# Patient Record
Sex: Female | Born: 1960 | Race: White | Hispanic: No | Marital: Married | Smoking: Never smoker
Health system: Southern US, Community
[De-identification: ages and names within clinical notes are randomized; demographics above are authoritative.]

## PROBLEM LIST (undated history)

## (undated) DIAGNOSIS — R519 Headache, unspecified: Secondary | ICD-10-CM

## (undated) DIAGNOSIS — Z8719 Personal history of other diseases of the digestive system: Secondary | ICD-10-CM

## (undated) DIAGNOSIS — R51 Headache: Secondary | ICD-10-CM

## (undated) DIAGNOSIS — K219 Gastro-esophageal reflux disease without esophagitis: Secondary | ICD-10-CM

## (undated) HISTORY — PX: TONSILLECTOMY: SUR1361

---

## 2004-06-30 ENCOUNTER — Ambulatory Visit: Payer: Self-pay | Admitting: Obstetrics and Gynecology

## 2004-07-11 ENCOUNTER — Ambulatory Visit: Payer: Self-pay | Admitting: Obstetrics and Gynecology

## 2005-08-15 ENCOUNTER — Ambulatory Visit: Payer: Self-pay | Admitting: Obstetrics and Gynecology

## 2006-09-26 ENCOUNTER — Ambulatory Visit: Payer: Self-pay | Admitting: Obstetrics and Gynecology

## 2006-09-28 ENCOUNTER — Ambulatory Visit: Payer: Self-pay | Admitting: Obstetrics and Gynecology

## 2007-11-07 ENCOUNTER — Ambulatory Visit: Payer: Self-pay | Admitting: Obstetrics and Gynecology

## 2008-11-17 ENCOUNTER — Ambulatory Visit: Payer: Self-pay | Admitting: Obstetrics and Gynecology

## 2009-11-30 ENCOUNTER — Ambulatory Visit: Payer: Self-pay | Admitting: Obstetrics and Gynecology

## 2010-12-14 ENCOUNTER — Ambulatory Visit: Payer: Self-pay | Admitting: Obstetrics and Gynecology

## 2011-06-28 ENCOUNTER — Ambulatory Visit: Payer: Self-pay | Admitting: General Practice

## 2011-12-20 ENCOUNTER — Ambulatory Visit: Payer: Self-pay | Admitting: Obstetrics and Gynecology

## 2013-10-27 ENCOUNTER — Ambulatory Visit: Payer: Self-pay | Admitting: Gastroenterology

## 2013-10-30 LAB — PATHOLOGY REPORT

## 2013-12-04 ENCOUNTER — Ambulatory Visit: Payer: Self-pay | Admitting: Surgery

## 2013-12-04 LAB — BASIC METABOLIC PANEL
Anion Gap: 5 — ABNORMAL LOW (ref 7–16)
BUN: 13 mg/dL (ref 7–18)
CALCIUM: 9.1 mg/dL (ref 8.5–10.1)
CHLORIDE: 108 mmol/L — AB (ref 98–107)
CO2: 26 mmol/L (ref 21–32)
Creatinine: 0.77 mg/dL (ref 0.60–1.30)
EGFR (Non-African Amer.): >60
Glucose: 96 mg/dL (ref 65–99)
Osmolality: 278 (ref 275–301)
Potassium: 3.8 mmol/L (ref 3.5–5.1)
SODIUM: 139 mmol/L (ref 136–145)

## 2013-12-04 LAB — CBC
HCT: 40.1 % (ref 35.0–47.0)
HGB: 13.5 g/dL (ref 12.0–16.0)
MCH: 30.4 pg (ref 26.0–34.0)
MCHC: 33.8 g/dL (ref 32.0–36.0)
MCV: 90 fL (ref 80–100)
Platelet: 377 10*3/uL (ref 150–440)
RBC: 4.45 10*6/uL (ref 3.80–5.20)
RDW: 13.2 % (ref 11.5–14.5)
WBC: 8.9 10*3/uL (ref 3.6–11.0)

## 2013-12-10 HISTORY — PX: HEMORRHOID SURGERY: SHX153

## 2013-12-11 ENCOUNTER — Ambulatory Visit: Payer: Self-pay | Admitting: Surgery

## 2013-12-12 LAB — PATHOLOGY REPORT

## 2014-07-13 ENCOUNTER — Ambulatory Visit: Payer: Self-pay | Admitting: Obstetrics and Gynecology

## 2015-01-02 NOTE — Op Note (Signed)
PATIENT NAME:  Rose Moore, Rose Moore MR#:  [id number] DATE OF BIRTH:  04/03/1961  DATE OF PROCEDURE:  12/11/2013  PREOPERATIVE DIAGNOSIS: Internal and external hemorrhoids, posterior anal fissure.   POSTOPERATIVE DIAGNOSIS: Internal and external hemorrhoids, posterior anal fissure.   PROCEDURE: Internal and external hemorrhoidectomy and lateral internal anal sphincterotomy.   SURGEON: Rochel Brome, M.D.   ANESTHESIA: General.   INDICATIONS: This 54 year old female has history of anal pain and bleeding. Has findings of a large hemorrhoid anteriorly also internal hemorrhoids. Also, a posterior anal fissure which has failed to heal with conservative therapy.   The patient was placed on the operating table in the supine position under general anesthesia. The legs were elevated into the lithotomy position using ankle straps. The anal area was prepared with Betadine solution and draped in a sterile manner.   Initial inspection revealed there was a large external hemorrhoid anteriorly. There was a posterior anal fissure. The anoderm and deeper tissues surrounding the sphincter were infiltrated with 20 mL of Exparel. Next, the anal canal was dilated large enough to admit four fingers. The bivalve retractor was introduced. There was a large external hemorrhoid anteriorly and slightly to the patient's right of the midline which appeared to be a column associated with a large external hemorrhoid. There were other smaller hemorrhoids which appeared to be circumferential and again noted the posterior anal fissure.   With the bivalve anal retractor in place. The internal and external hemorrhoidectomy was carried out at the 11 o'clock position. A high ligation was done with a 3-0 chromic suture ligature. A V-shaped incision was made externally. Electrocautery was used to dissect the external hemorrhoid away from surrounding tissues. This dissection was carried over the internal sphincter and up to include the internal  hemorrhoid to the previously placed suture ligature. The hemorrhoid was again ligated with the same suture ligature and amputated. Several small bleeding points were cauterized. Hemostasis was subsequently intact. The wound was closed with a running locked tied 3-0 chromic suture, leaving a small opening externally for drainage. Next, an additional hemorrhoid, which was external was excised with a transversely oriented incision extending from 12:00 to 2 o'clock position and several small bleeding points were cauterized and the wound was closed with interrupted 5-0 chromic simple sutures.   Next, the lateral internal anal sphincterotomy was done by making an incision on the patient's left side at the three o'clock position. This incision was 12 mm in length and carried down through subcutaneous tissues. Hemostats were used for blunt dissection and identified the internal anal sphincter, recognized its white color, 90% of the sphincter was divided with electrocautery. Bleeding was minimal and this wound was left open for drainage, although  skin edges were actually touching at the end of the procedure. The area was cleaned with saline solution and dressed with 4 x 4 gauze, 2 inch paper tape.   The patient appeared to be in satisfactory condition and was carried to the recovery room for postoperative care.   ____________________________ J. Rochel Brome, MD jws:sg D: 12/11/2013 11:49:15 ET T: 12/11/2013 12:16:08 ET JOB#: [id number]  cc: Loreli Dollar, MD, <Dictator> Loreli Dollar MD ELECTRONICALLY SIGNED 12/23/2013 8:48

## 2015-06-21 NOTE — H&P (Signed)
Chief Complaint:    Patient ID: Rose Moore is a 54 y.o. female presenting with Pre Op Consulting  on 06/14/2015  HPI: Preop visit for definitive treatment of AUB - Sx of menometrorrhagia.   SIS and ultrasound on 04/13/15 Showed: - Uterus: 159cm3, 8x6x5cm - Ovaries: normal and small - Other: fibroid 1.9cm - rt lat posterior fibroid. Submuscoal calcified fibroid noted 2cm.  Prior EMBx normal 05/28/14. TSH and prolactin both wnl. Husband with vasectomy. Prior pap neg in 2013 with neg HPV.  Pelvic exam noted 2 cm cervical polyp that was non-tender and not friable. No bx taken.  Had an allergic reaction to progesterone only pill and full bodied itching, but no hives.  Also has stress urinary incontinence, that doesn't bother her significantly but she would like investigated eventually. She also has anterior prolapse she would like repaired.  Past Medical History:  has a past medical history of Abnormal uterine bleeding; Anal fissure; Constipation due to slow transit; Fibroid; GERD (gastroesophageal reflux disease); Hemorrhoids; and Urinary incontinence, mixed.  Past Surgical History:  has a past surgical history that includes Hemorrhoidectomy Internal & External W/Fissurectomy. Family History: family history includes Diabetes mellitus in her mother; Diabetes type II in her mother; Hyperlipidemia in her father and mother; Hypertension in her father and mother; Thyroid disease in her mother. Social History:  reports that she has never smoked. She does not have any smokeless tobacco history on file. She reports that she does not drink alcohol or use illicit drugs. OB/GYN History:  OB History    Gravida Para Term Preterm AB TAB SAB Ectopic Multiple Living   3 2 2  1  1   2       Allergies: is allergic to amoxicillin and sulfa (sulfonamide antibiotics). Medications:  Current Outpatient Prescriptions:  . docusate (COLACE) 100 MG capsule, Take 100 mg by mouth 2  (two) times daily., Disp: , Rfl:  . LANSOPRAZOLE (PREVACID ORAL), Take 15 mg by mouth once daily., Disp: , Rfl:  . multivitamin capsule, Take 1 capsule by mouth once daily., Disp: , Rfl:  . norethindrone (MICRONOR) 0.35 mg tablet, Take 1 tablet (0.35 mg total) by mouth once daily., Disp: 1 Package, Rfl: 11 . ranitidine (ZANTAC) 150 MG tablet, Take 150 mg by mouth 2 (two) times daily., Disp: , Rfl:   Review of Systems  Constitutional: Negative. Negative for fatigue and fever.  HENT: Negative.  Respiratory: Negative. Negative for shortness of breath.  Cardiovascular: Negative for chest pain and palpitations.  Gastrointestinal: Negative for abdominal pain, constipation and diarrhea.  Endocrine: Negative for cold intolerance.  Genitourinary: Positive for menstrual problem. Negative for difficulty urinating, dyspareunia, dysuria, frequency, hematuria, pelvic pain, vaginal bleeding, vaginal discharge and vaginal pain.  Abnormal uterine bleeding  Neurological: Negative for dizziness and light-headedness.  Psychiatric/Behavioral:  No changes in mood    Exam:         Visit Vitals  . BP 138/90  . Pulse 88  . Wt 99.3 kg (219 lb)  . BMI 35.08 kg/m2   Physical Exam  Constitutional: She is oriented to person, place, and time. She appears well-developed and well-nourished. No distress.  Eyes: No scleral icterus.  Neck: Normal range of motion. Neck supple. No tracheal deviation present. No thyromegaly present.  Cardiovascular: Normal rate, regular rhythm and normal heart sounds.  No murmur heard. Pulmonary/Chest: Effort normal and breath sounds normal. She has no wheezes. She has no rales.  Abdominal: She exhibits no distension and no mass. There  is no tenderness. There is no rebound and no guarding.  Genitourinary:  Genitourinary Comments:  External: Tanner stage 5, normal female genitalia without lesions or masses Bladder: Normal size without masses or tenderness, Grade 1  cystocele Urethra: No lesions or discharge with palpation. Normal urethral size and location, no prolapse Vagina: normal physiological discharge, without lesions or masses Cervix: normal without lesions or masses Adnexa: normal bimanual exam without masses or fullness Uterus: Normal size and position without masses or tenderness. Good descent. Anus/Perineum: Normal external exam  Musculoskeletal: Normal range of motion.  Lymphadenopathy:  She has no cervical adenopathy.  Neurological: She is alert and oriented to person, place, and time.  Skin: Skin is warm and dry.  Psychiatric: She has a normal mood and affect.     Impression:   The primary encounter diagnosis was Abnormal uterine bleeding (AUB). Diagnoses of Submucous leiomyoma of uterus, Prolapse of anterior vaginal wall, and SUI (stress urinary incontinence, female) were also pertinent to this visit.    Plan:   Patient returns for a preoperative discussion regarding her plans to proceed with definitive surgical treatment of her AUB and fibroids by total vaginal hysterectomy with bilateral salpingectomy, with possible anterior repair and possible cystoscopy.  The patient and I discussed the technical aspects of the procedure including the potential for risks and complications. These include but are not limited to the risk of infection requiring post-operative antibiotics or further procedures. We talked about the risk of injury to adjacent organs including bladder, bowel, ureter, blood vessels or nerves. We talked about the need to convert to an open incision. We talked about the possible need for blood transfusion. We talked aboutpostop complications such asthromboembolic or cardiopulmonary complications. All of her questions were answered. Her preoperative exam was completed and the appropriate consents were signed. She is scheduled to undergo this procedure in the near future.

## 2015-06-24 ENCOUNTER — Encounter
Admission: RE | Admit: 2015-06-24 | Discharge: 2015-06-24 | Disposition: A | Discharge status: Home / Self Care | Payer: BLUE CROSS/BLUE SHIELD | Source: Ambulatory Visit | Attending: Obstetrics and Gynecology | Admitting: Obstetrics and Gynecology

## 2015-06-24 DIAGNOSIS — Z01818 Encounter for other preprocedural examination: Secondary | ICD-10-CM | POA: Diagnosis present

## 2015-06-24 HISTORY — DX: Personal history of other diseases of the digestive system: Z87.19

## 2015-06-24 HISTORY — DX: Gastro-esophageal reflux disease without esophagitis: K21.9

## 2015-06-24 HISTORY — DX: Headache: R51

## 2015-06-24 HISTORY — DX: Headache, unspecified: R51.9

## 2015-06-24 LAB — BASIC METABOLIC PANEL
ANION GAP: 5 (ref 5–15)
BUN: 19 mg/dL (ref 6–20)
CALCIUM: 9.4 mg/dL (ref 8.9–10.3)
CO2: 26 mmol/L (ref 22–32)
Chloride: 104 mmol/L (ref 101–111)
Creatinine, Ser: 0.61 mg/dL (ref 0.44–1.00)
Glucose, Bld: 95 mg/dL (ref 65–99)
POTASSIUM: 4.1 mmol/L (ref 3.5–5.1)
Sodium: 135 mmol/L (ref 135–145)

## 2015-06-24 LAB — CBC
HEMATOCRIT: 37.6 % (ref 35.0–47.0)
HEMOGLOBIN: 13 g/dL (ref 12.0–16.0)
MCH: 29.8 pg (ref 26.0–34.0)
MCHC: 34.4 g/dL (ref 32.0–36.0)
MCV: 86.6 fL (ref 80.0–100.0)
Platelets: 402 10*3/uL (ref 150–440)
RBC: 4.35 MIL/uL (ref 3.80–5.20)
RDW: 13.8 % (ref 11.5–14.5)
WBC: 10 10*3/uL (ref 3.6–11.0)

## 2015-06-24 LAB — ABO/RH: ABO/RH(D): O NEG

## 2015-06-24 LAB — TYPE AND SCREEN
ABO/RH(D): O NEG
ANTIBODY SCREEN: NEGATIVE

## 2015-06-24 NOTE — Patient Instructions (Signed)
  Your procedure is scheduled on: July 12, 2015 (Monday) Report to Day Surgery.[facility]) To find out your arrival time please call [phone] between 1PM - 3PM on July 09, 2015 (Friday)  Remember: Instructions that are not followed completely may result in serious medical risk, up to and including death, or upon the discretion of your surgeon and anesthesiologist your surgery may need to be rescheduled.    __x__ 1. Do not eat food or drink liquids after midnight. No gum chewing or hard candies.     ____ 2. No Alcohol for 24 hours before or after surgery.   ____ 3. Bring all medications with you on the day of surgery if instructed.    _x___ 4. Notify your doctor if there is any change in your medical condition     (cold, fever, infections).     Do not wear jewelry, make-up, hairpins, clips or nail polish.  Do not wear lotions, powders, or perfumes. You may wear deodorant.  Do not shave 48 hours prior to surgery. Men may shave face and neck.  Do not bring valuables to the hospital.    [facility] is not responsible for any belongings or valuables.               Contacts, dentures or bridgework may not be worn into surgery.  Leave your suitcase in the car. After surgery it may be brought to your room.  For patients admitted to the hospital, discharge time is determined by your                treatment team.   Patients discharged the day of surgery will not be allowed to drive home.   Please read over the following fact sheets that you were given:   Surgical Site Infection Prevention   __x__ Take these medicines the morning of surgery with A SIP OF WATER:    1. Lansoprazole (Lansoprazole at bedtime on 10/30)  2. Magnesium  3.   4.  5.  6.  ____ Fleet Enema (as directed)   __x__ Use CHG Soap as directed  ____ Use inhalers on the day of surgery  ____ Stop metformin 2 days prior to surgery    ____ Take 1/2 of usual insulin dose the night before surgery and none  on the morning of surgery.   ____ Stop Coumadin/Plavix/aspirin on   _x___ Stop Anti-inflammatories on (Tylenol ok to take for pain)   ____ Stop supplements until after surgery.    ____ Bring C-Pap to the hospital.

## 2015-07-12 ENCOUNTER — Encounter: Payer: Self-pay | Admitting: *Deleted

## 2015-07-12 ENCOUNTER — Ambulatory Visit: Payer: BLUE CROSS/BLUE SHIELD | Admitting: Anesthesiology

## 2015-07-12 ENCOUNTER — Encounter
Admission: RE | Disposition: A | Discharge status: Home / Self Care | Payer: Self-pay | Source: Ambulatory Visit | Attending: Obstetrics and Gynecology

## 2015-07-12 ENCOUNTER — Observation Stay
Admission: RE | Admit: 2015-07-12 | Discharge: 2015-07-13 | Disposition: A | Discharge status: Home / Self Care | Payer: BLUE CROSS/BLUE SHIELD | Source: Ambulatory Visit | Attending: Obstetrics and Gynecology | Admitting: Obstetrics and Gynecology

## 2015-07-12 DIAGNOSIS — N841 Polyp of cervix uteri: Secondary | ICD-10-CM | POA: Insufficient documentation

## 2015-07-12 DIAGNOSIS — D250 Submucous leiomyoma of uterus: Secondary | ICD-10-CM | POA: Insufficient documentation

## 2015-07-12 DIAGNOSIS — Z79899 Other long term (current) drug therapy: Secondary | ICD-10-CM | POA: Insufficient documentation

## 2015-07-12 DIAGNOSIS — Z882 Allergy status to sulfonamides status: Secondary | ICD-10-CM | POA: Insufficient documentation

## 2015-07-12 DIAGNOSIS — Z888 Allergy status to other drugs, medicaments and biological substances status: Secondary | ICD-10-CM | POA: Diagnosis not present

## 2015-07-12 DIAGNOSIS — N72 Inflammatory disease of cervix uteri: Secondary | ICD-10-CM | POA: Diagnosis not present

## 2015-07-12 DIAGNOSIS — K219 Gastro-esophageal reflux disease without esophagitis: Secondary | ICD-10-CM | POA: Diagnosis not present

## 2015-07-12 DIAGNOSIS — N921 Excessive and frequent menstruation with irregular cycle: Secondary | ICD-10-CM | POA: Diagnosis not present

## 2015-07-12 DIAGNOSIS — N393 Stress incontinence (female) (male): Secondary | ICD-10-CM | POA: Insufficient documentation

## 2015-07-12 DIAGNOSIS — K449 Diaphragmatic hernia without obstruction or gangrene: Secondary | ICD-10-CM | POA: Insufficient documentation

## 2015-07-12 DIAGNOSIS — R51 Headache: Secondary | ICD-10-CM | POA: Diagnosis not present

## 2015-07-12 DIAGNOSIS — N3946 Mixed incontinence: Secondary | ICD-10-CM | POA: Insufficient documentation

## 2015-07-12 DIAGNOSIS — N800 Endometriosis of uterus: Secondary | ICD-10-CM | POA: Insufficient documentation

## 2015-07-12 DIAGNOSIS — Z881 Allergy status to other antibiotic agents status: Secondary | ICD-10-CM | POA: Diagnosis not present

## 2015-07-12 DIAGNOSIS — Z9889 Other specified postprocedural states: Secondary | ICD-10-CM

## 2015-07-12 DIAGNOSIS — N938 Other specified abnormal uterine and vaginal bleeding: Secondary | ICD-10-CM | POA: Insufficient documentation

## 2015-07-12 HISTORY — PX: VAGINAL HYSTERECTOMY: SHX2639

## 2015-07-12 LAB — POCT PREGNANCY, URINE: PREG TEST UR: NEGATIVE

## 2015-07-12 SURGERY — HYSTERECTOMY, VAGINAL
Anesthesia: General

## 2015-07-12 MED ORDER — LIDOCAINE-EPINEPHRINE 1 %-1:100000 IJ SOLN
INTRAMUSCULAR | Status: AC
  Filled 2015-07-12: qty 1

## 2015-07-12 MED ORDER — ACETAMINOPHEN 10 MG/ML IV SOLN
INTRAVENOUS | Status: DC | PRN
  Administered 2015-07-12: 1000 mg via INTRAVENOUS

## 2015-07-12 MED ORDER — FENTANYL CITRATE (PF) 100 MCG/2ML IJ SOLN
25.0000 ug | INTRAMUSCULAR | Status: DC | PRN
  Administered 2015-07-12 (×4): 25 ug via INTRAVENOUS

## 2015-07-12 MED ORDER — LIDOCAINE HCL (CARDIAC) 20 MG/ML IV SOLN
INTRAVENOUS | Status: DC | PRN
  Administered 2015-07-12: 100 mg via INTRAVENOUS

## 2015-07-12 MED ORDER — MIDAZOLAM HCL 2 MG/2ML IJ SOLN
INTRAMUSCULAR | Status: DC | PRN
  Administered 2015-07-12: 2 mg via INTRAVENOUS

## 2015-07-12 MED ORDER — ACETAMINOPHEN 10 MG/ML IV SOLN
INTRAVENOUS | Status: AC
  Filled 2015-07-12: qty 100

## 2015-07-12 MED ORDER — ONDANSETRON HCL 4 MG/2ML IJ SOLN
INTRAMUSCULAR | Status: DC | PRN
  Administered 2015-07-12: 4 mg via INTRAVENOUS

## 2015-07-12 MED ORDER — HYDROMORPHONE HCL 1 MG/ML IJ SOLN
INTRAMUSCULAR | Status: AC
  Filled 2015-07-12: qty 1

## 2015-07-12 MED ORDER — LACTATED RINGERS IV SOLN
INTRAVENOUS | Status: DC
  Administered 2015-07-12 (×2): via INTRAVENOUS

## 2015-07-12 MED ORDER — PANTOPRAZOLE SODIUM 40 MG PO TBEC
40.0000 mg | DELAYED_RELEASE_TABLET | Freq: Every day | ORAL | Status: DC
  Administered 2015-07-13: 40 mg via ORAL
  Filled 2015-07-12: qty 1

## 2015-07-12 MED ORDER — PANTOPRAZOLE SODIUM 20 MG PO TBEC
20.0000 mg | DELAYED_RELEASE_TABLET | Freq: Every day | ORAL | Status: DC
  Filled 2015-07-12: qty 1

## 2015-07-12 MED ORDER — MENTHOL 3 MG MT LOZG: 1.0000 | LOZENGE | OROMUCOSAL | Status: DC | PRN

## 2015-07-12 MED ORDER — HYDROMORPHONE HCL 1 MG/ML IJ SOLN
INTRAMUSCULAR | Status: AC
  Administered 2015-07-12: 0.5 mg via INTRAVENOUS
  Filled 2015-07-12: qty 1

## 2015-07-12 MED ORDER — HYDROMORPHONE HCL 1 MG/ML IJ SOLN
0.2000 mg | INTRAMUSCULAR | Status: DC | PRN
  Administered 2015-07-12: 0.4 mg via INTRAVENOUS
  Administered 2015-07-12: 0.2 mg via INTRAVENOUS
  Administered 2015-07-12 – 2015-07-13 (×5): 0.4 mg via INTRAVENOUS
  Filled 2015-07-12 (×8): qty 1

## 2015-07-12 MED ORDER — CLINDAMYCIN PHOSPHATE 900 MG/50ML IV SOLN
900.0000 mg | INTRAVENOUS | Status: AC
  Administered 2015-07-12: 900 mg via INTRAVENOUS

## 2015-07-12 MED ORDER — FENTANYL CITRATE (PF) 100 MCG/2ML IJ SOLN
INTRAMUSCULAR | Status: DC | PRN
  Administered 2015-07-12 (×2): 25 ug via INTRAVENOUS
  Administered 2015-07-12 (×2): 50 ug via INTRAVENOUS
  Administered 2015-07-12: 100 ug via INTRAVENOUS

## 2015-07-12 MED ORDER — EPHEDRINE SULFATE 50 MG/ML IJ SOLN
INTRAMUSCULAR | Status: DC | PRN
  Administered 2015-07-12: 5 mg via INTRAVENOUS

## 2015-07-12 MED ORDER — FENTANYL CITRATE (PF) 100 MCG/2ML IJ SOLN
INTRAMUSCULAR | Status: AC
  Administered 2015-07-12: 25 ug via INTRAVENOUS
  Filled 2015-07-12: qty 2

## 2015-07-12 MED ORDER — HYDROMORPHONE HCL 1 MG/ML IJ SOLN
0.2500 mg | INTRAMUSCULAR | Status: DC | PRN
  Administered 2015-07-12 (×2): 0.5 mg via INTRAVENOUS

## 2015-07-12 MED ORDER — ADULT MULTIVITAMIN W/MINERALS CH
1.0000 | ORAL_TABLET | Freq: Every day | ORAL | Status: DC
  Administered 2015-07-12 – 2015-07-13 (×2): 1 via ORAL
  Filled 2015-07-12 (×2): qty 1

## 2015-07-12 MED ORDER — DEXAMETHASONE SODIUM PHOSPHATE 4 MG/ML IJ SOLN
INTRAMUSCULAR | Status: DC | PRN
  Administered 2015-07-12: 8 mg via INTRAVENOUS

## 2015-07-12 MED ORDER — ONDANSETRON HCL 4 MG/2ML IJ SOLN
4.0000 mg | Freq: Four times a day (QID) | INTRAMUSCULAR | Status: DC | PRN
  Filled 2015-07-12: qty 2

## 2015-07-12 MED ORDER — GENTAMICIN SULFATE 40 MG/ML IJ SOLN
5.0000 mg/kg | INTRAVENOUS | Status: AC
  Administered 2015-07-12: 490 mg via INTRAVENOUS
  Filled 2015-07-12: qty 12.25

## 2015-07-12 MED ORDER — SUGAMMADEX SODIUM 200 MG/2ML IV SOLN
INTRAVENOUS | Status: DC | PRN
  Administered 2015-07-12: 196 mg via INTRAVENOUS

## 2015-07-12 MED ORDER — MAGNESIUM OXIDE 400 (241.3 MG) MG PO TABS
400.0000 mg | ORAL_TABLET | Freq: Every day | ORAL | Status: DC
  Administered 2015-07-12 – 2015-07-13 (×2): 400 mg via ORAL
  Filled 2015-07-12 (×2): qty 1

## 2015-07-12 MED ORDER — OXYCODONE-ACETAMINOPHEN 5-325 MG PO TABS
1.0000 | ORAL_TABLET | ORAL | Status: DC | PRN
  Administered 2015-07-12 – 2015-07-13 (×3): 2 via ORAL
  Filled 2015-07-12 (×3): qty 2

## 2015-07-12 MED ORDER — GENTAMICIN SULFATE 40 MG/ML IJ SOLN: INTRAVENOUS | Status: DC

## 2015-07-12 MED ORDER — GLYCOPYRROLATE 0.2 MG/ML IJ SOLN
INTRAMUSCULAR | Status: DC | PRN
  Administered 2015-07-12: 0.2 mg via INTRAVENOUS

## 2015-07-12 MED ORDER — ONDANSETRON HCL 4 MG PO TABS
4.0000 mg | ORAL_TABLET | Freq: Four times a day (QID) | ORAL | Status: DC | PRN
  Administered 2015-07-13: 4 mg via ORAL
  Filled 2015-07-12: qty 1

## 2015-07-12 MED ORDER — LIDOCAINE-EPINEPHRINE 1 %-1:100000 IJ SOLN
INTRAMUSCULAR | Status: DC | PRN
  Administered 2015-07-12: 17 mL

## 2015-07-12 MED ORDER — ONDANSETRON HCL 4 MG/2ML IJ SOLN: 4.0000 mg | Freq: Once | INTRAMUSCULAR | Status: DC | PRN

## 2015-07-12 MED ORDER — IBUPROFEN 600 MG PO TABS
600.0000 mg | ORAL_TABLET | Freq: Four times a day (QID) | ORAL | Status: DC | PRN
  Administered 2015-07-13: 600 mg via ORAL
  Filled 2015-07-12: qty 1

## 2015-07-12 MED ORDER — KETOROLAC TROMETHAMINE 30 MG/ML IJ SOLN
30.0000 mg | Freq: Once | INTRAMUSCULAR | Status: AC
  Administered 2015-07-12: 30 mg via INTRAVENOUS
  Filled 2015-07-12: qty 1

## 2015-07-12 MED ORDER — DOCUSATE SODIUM 50 MG PO CAPS: 250.0000 mg | ORAL_CAPSULE | Freq: Every day | ORAL | Status: DC

## 2015-07-12 MED ORDER — ROCURONIUM BROMIDE 100 MG/10ML IV SOLN
INTRAVENOUS | Status: DC | PRN
  Administered 2015-07-12: 10 mg via INTRAVENOUS
  Administered 2015-07-12: 5 mg via INTRAVENOUS
  Administered 2015-07-12: 10 mg via INTRAVENOUS
  Administered 2015-07-12: 40 mg via INTRAVENOUS
  Administered 2015-07-12 (×3): 5 mg via INTRAVENOUS

## 2015-07-12 MED ORDER — LACTATED RINGERS IV SOLN
INTRAVENOUS | Status: DC
  Administered 2015-07-12 – 2015-07-13 (×2): via INTRAVENOUS

## 2015-07-12 MED ORDER — PHENYLEPHRINE HCL 10 MG/ML IJ SOLN
INTRAMUSCULAR | Status: DC | PRN
  Administered 2015-07-12 (×3): 50 ug via INTRAVENOUS

## 2015-07-12 MED ORDER — CLINDAMYCIN PHOSPHATE 900 MG/50ML IV SOLN
INTRAVENOUS | Status: AC
  Administered 2015-07-12: 08:00:00
  Filled 2015-07-12: qty 50

## 2015-07-12 MED ORDER — CEFAZOLIN SODIUM-DEXTROSE 2-3 GM-% IV SOLR: 2.0000 g | INTRAVENOUS | Status: DC

## 2015-07-12 MED ORDER — PROPOFOL 10 MG/ML IV BOLUS
INTRAVENOUS | Status: DC | PRN
  Administered 2015-07-12: 160 mg via INTRAVENOUS

## 2015-07-12 SURGICAL SUPPLY — 43 items
BAG URO DRAIN 2000ML W/SPOUT (MISCELLANEOUS) ×4 IMPLANT
BNDG GAUZE 4.5X4.1 6PLY STRL (MISCELLANEOUS) IMPLANT
CANISTER SUCT 1200ML W/VALVE (MISCELLANEOUS) ×4 IMPLANT
CATH FOLEY 2WAY  5CC 16FR (CATHETERS) ×2
CATH ROBINSON RED A/P 16FR (CATHETERS) ×4 IMPLANT
CATH URTH 16FR FL 2W BLN LF (CATHETERS) ×2 IMPLANT
COUNTER NEEDLE 20/40 LG (NEEDLE) ×4 IMPLANT
DRAPE PERI LITHO V/GYN (MISCELLANEOUS) ×4 IMPLANT
DRAPE SURG 17X11 SM STRL (DRAPES) ×4 IMPLANT
DRAPE UNDER BUTTOCK W/FLU (DRAPES) ×4 IMPLANT
ETHIBOND 2 0 GREEN CT 2 30IN (SUTURE) IMPLANT
GLOVE BIO SURGEON STRL SZ 6.5 (GLOVE) ×12 IMPLANT
GLOVE BIO SURGEONS STRL SZ 6.5 (GLOVE) ×4
GLOVE INDICATOR 7.0 STRL GRN (GLOVE) ×16 IMPLANT
GOWN STRL REUS W/ TWL LRG LVL3 (GOWN DISPOSABLE) ×8 IMPLANT
GOWN STRL REUS W/ TWL XL LVL3 (GOWN DISPOSABLE) ×2 IMPLANT
GOWN STRL REUS W/TWL LRG LVL3 (GOWN DISPOSABLE) ×8
GOWN STRL REUS W/TWL XL LVL3 (GOWN DISPOSABLE) ×2
IV LACTATED RINGERS 1000ML (IV SOLUTION) IMPLANT
KIT RM TURNOVER CYSTO AR (KITS) ×4 IMPLANT
LABEL OR SOLS (LABEL) IMPLANT
NDL SAFETY 22GX1.5 (NEEDLE) ×4 IMPLANT
NS IRRIG 500ML POUR BTL (IV SOLUTION) ×4 IMPLANT
PACK BASIN MINOR ARMC (MISCELLANEOUS) ×4 IMPLANT
PAD GROUND ADULT SPLIT (MISCELLANEOUS) ×4 IMPLANT
PAD OB MATERNITY 4.3X12.25 (PERSONAL CARE ITEMS) ×4 IMPLANT
PAD PREP 24X41 OB/GYN DISP (PERSONAL CARE ITEMS) ×4 IMPLANT
SET CYSTO W/LG BORE CLAMP LF (SET/KITS/TRAYS/PACK) IMPLANT
SPONGE XRAY 4X4 16PLY STRL (MISCELLANEOUS) IMPLANT
SURGILUBE 2OZ TUBE FLIPTOP (MISCELLANEOUS) ×4 IMPLANT
SUT PDS 2-0 27IN (SUTURE) IMPLANT
SUT PDS AB 2-0 CT1 27 (SUTURE) IMPLANT
SUT VIC AB 0 CT1 27 (SUTURE) ×6
SUT VIC AB 0 CT1 27XCR 8 STRN (SUTURE) ×6 IMPLANT
SUT VIC AB 0 CT1 36 (SUTURE) ×4 IMPLANT
SUT VIC AB 2-0 CT1 36 (SUTURE) IMPLANT
SUT VIC AB 2-0 SH 27 (SUTURE) ×2
SUT VIC AB 2-0 SH 27XBRD (SUTURE) ×2 IMPLANT
SUT VIC AB 3-0 SH 27 (SUTURE)
SUT VIC AB 3-0 SH 27X BRD (SUTURE) IMPLANT
SYR CONTROL 10ML (SYRINGE) ×4 IMPLANT
SYRINGE 10CC LL (SYRINGE) ×4 IMPLANT
WATER STERILE IRR 1000ML POUR (IV SOLUTION) IMPLANT

## 2015-07-12 NOTE — Anesthesia Preprocedure Evaluation (Signed)
Anesthesia Evaluation  Patient identified by MRN, date of birth, ID band Patient awake    Reviewed: Allergy & Precautions, NPO status , Patient's Chart, lab work & pertinent test results  History of Anesthesia Complications Negative for: history of anesthetic complications  Airway Mallampati: III       Dental  (+) Teeth Intact   Pulmonary neg pulmonary ROS,           Cardiovascular negative cardio ROS       Neuro/Psych  Headaches,    GI/Hepatic Neg liver ROS, hiatal hernia, GERD  Medicated and Controlled,  Endo/Other  negative endocrine ROS  Renal/GU negative Renal ROS     Musculoskeletal   Abdominal   Peds  Hematology   Anesthesia Other Findings   Reproductive/Obstetrics                             Anesthesia Physical Anesthesia Plan  ASA: II  Anesthesia Plan: General   Post-op Pain Management:    Induction: Intravenous  Airway Management Planned: Oral ETT  Additional Equipment:   Intra-op Plan:   Post-operative Plan:   Informed Consent: I have reviewed the patients History and Physical, chart, labs and discussed the procedure including the risks, benefits and alternatives for the proposed anesthesia with the patient or authorized representative who has indicated his/her understanding and acceptance.     Plan Discussed with:   Anesthesia Plan Comments:         Anesthesia Quick Evaluation

## 2015-07-12 NOTE — Op Note (Signed)
Ardelle Anton PROCEDURE DATE: 07/12/2015  PREOPERATIVE DIAGNOSIS:   Abnormal uterine bleedings- fibroid POSTOPERATIVE DIAGNOSIS:   Same SURGEON:   Benjaman Kindler, M.D. ASSISTANT: Laverta Baltimore, M.D. OPERATION:  Total Vaginal Hysterectomy, bilateral salpingectomy ANESTHESIA:  General endotracheal.  INDICATIONS: The patient is a 54 y.o. No obstetric history on file. with history of AUB-F. The patient made a decision to undergo definite surgical treatment. On the preoperative visit, the risks, benefits, indications, and alternatives of the procedure were reviewed with the patient.  On the day of surgery, the risks of surgery were again discussed with the patient including but not limited to: bleeding which may require transfusion or reoperation; infection which may require antibiotics; injury to bowel, bladder, ureters or other surrounding organs; need for additional procedures; thromboembolic phenomenon, incisional problems and other postoperative/anesthesia complications. Written informed consent was obtained.    OPERATIVE FINDINGS: A 8 week size uterus with normal tubes and ovaries bilaterally.  ESTIMATED BLOOD LOSS: 100 ml FLUIDS:  800 ml of Lactated Ringers URINE OUTPUT:  150 ml of clear yellow urine. SPECIMENS:  Uterus and cervix and tubes sent to pathology COMPLICATIONS:  None immediate.  DESCRIPTION OF PROCEDURE:  The patient received prophylactic intravenous antibiotics and had sequential compression devices applied to her lower extremities while in the preoperative area.    She was taken to the operating room, where she was identified by name and birth date. General anesthesia was administered and was found to be adequate.  She was placed in the dorsal lithotomy position, and was prepped and draped in a sterile manner.  A formal time out procedure was performed with all team members present and in agreement. A Foley catheter was inserted into her bladder and attached to  gravity drainage. Attention was turned to her pelvis. Of note, all sutures used in this case were 0 Vicryl unless otherwise noted.     A weighted speculum was placed in the vagina, and the anterior and posterior lips of the cervix were grasped bilaterally with thyroid tenaculums.  The cervix was then injected circumferentially with 0.25% Marcaine with epinephrine solution to maintain hemostasis.  The cervix was circumferentially incised using electrocautery, and the posterior cul-de-sac was entered sharply in the midline.   A long weighted speculum was inserted into the posterior cul-de-sac. The bladder was dissected off the pubocervical fascia anteriorly with sharp and careful blunt dissection without complication.  The anterior cul-de-sac was then entered sharply without difficulty and the bladder retracted out of the operative field behind a retractor. The Heaney clamp was then used to clamp the uterosacral ligaments on either side.  They were then cut and sutured ligated with 0 Vicryl, and the ligated uterosacral ligaments were transfixed to the ipsilateral vaginal epithelium to further support the vagina and provide hemostasis. The cardinal ligaments were then clamped, cut and ligated. The uterine vessels and broad ligaments were then serially clamped with the Heaney clamps, cut, and suture ligated on both sides.  Excellent hemostasis was noted at this point.    The uterus was then delivered via the posterior cul-de-sac, and the cornua were clamped with the Heaney clamps, transected, and the uterine specimen was delivered and sent to pathology. These pedicles were then suture ligated to ensure hemostasis.   The Fallopian tubes were then identified and individually grasped with Babcock clamps. They were clamped across entirely with a Heaney clamp and free-tied, followed by suture ligation for excellent hemostasis bilaterally. The ovaries were left intact and in place.  After completion of  the  hysterectomy, all pedicles from the uterosacral ligament to the cornua were examined hemostasis was confirmed.  The vaginal cuff was then closed with in a running locked fashion with care given to incorporate the uterosacral pedicles bilaterally.  All instruments were then removed from the pelvis.  The patient tolerated the procedure well.  All instruments, needles, and sponge counts were correct x 2. The patient was taken to the recovery room in stable condition.    Angelina Pih, MD, MPH

## 2015-07-12 NOTE — Anesthesia Postprocedure Evaluation (Signed)
  Anesthesia Post-op Note  Patient: Rose Moore  Procedure(s) Performed: Procedure(s): HYSTERECTOMY VAGINAL/BILATERAL SALPINGECTOMY (Bilateral)  Anesthesia type:General  Patient location: PACU  Post pain: Pain level controlled  Post assessment: Post-op Vital signs reviewed, Patient's Cardiovascular Status Stable, Respiratory Function Stable, Patent Airway and No signs of Nausea or vomiting  Post vital signs: Reviewed and stable  Last Vitals:  Filed Vitals:   07/12/15 1156  BP: 134/74  Pulse: 84  Temp: 36.7 C  Resp: 16    Level of consciousness: awake, alert  and patient cooperative  Complications: No apparent anesthesia complications

## 2015-07-12 NOTE — Transfer of Care (Signed)
Immediate Anesthesia Transfer of Care Note  Patient: Rose Moore  Procedure(s) Performed: Procedure(s): HYSTERECTOMY VAGINAL/BILATERAL SALPINGECTOMY (Bilateral)  Patient Location: PACU  Anesthesia Type:General  Level of Consciousness: awake, alert  and oriented  Airway & Oxygen Therapy: Patient Spontanous Breathing and Patient connected to face mask oxygen  Post-op Assessment: Report given to RN and Post -op Vital signs reviewed and stable  Post vital signs: Reviewed and stable  Last Vitals:  Filed Vitals:   07/12/15 0615  BP: 146/97  Pulse: 106  Temp: 36.7 C  Resp: 18    Complications: No apparent anesthesia complications

## 2015-07-12 NOTE — Interval H&P Note (Signed)
History and Physical Interval Note:  07/12/2015 7:39 AM  Rose Moore  has presented today for surgery, with the diagnosis of FIBROID UTERUS  The various methods of treatment have been discussed with the patient and family. After consideration of risks, benefits and other options for treatment, the patient has consented to  Procedure(s): HYSTERECTOMY VAGINAL/BILATERAL SALPINGECTOMY (Bilateral) ANTERIOR REPAIR (CYSTOCELE) (N/A) CYSTOSCOPY (N/A) (possible anterior repair, possible cystoscopy) as a surgical intervention .  The patient's history has been reviewed, patient examined, no change in status, stable for surgery.  I have reviewed the patient's chart and labs.  Questions were answered to the patient's satisfaction.     Benjaman Kindler

## 2015-07-13 DIAGNOSIS — N72 Inflammatory disease of cervix uteri: Secondary | ICD-10-CM | POA: Diagnosis not present

## 2015-07-13 LAB — BASIC METABOLIC PANEL
ANION GAP: 6 (ref 5–15)
BUN: 10 mg/dL (ref 6–20)
CHLORIDE: 104 mmol/L (ref 101–111)
CO2: 27 mmol/L (ref 22–32)
Calcium: 8.8 mg/dL — ABNORMAL LOW (ref 8.9–10.3)
Creatinine, Ser: 0.63 mg/dL (ref 0.44–1.00)
Glucose, Bld: 108 mg/dL — ABNORMAL HIGH (ref 65–99)
Potassium: 3.8 mmol/L (ref 3.5–5.1)
Sodium: 137 mmol/L (ref 135–145)

## 2015-07-13 LAB — CBC
HEMATOCRIT: 34.6 % — AB (ref 35.0–47.0)
HEMOGLOBIN: 11.8 g/dL — AB (ref 12.0–16.0)
MCH: 30 pg (ref 26.0–34.0)
MCHC: 34 g/dL (ref 32.0–36.0)
MCV: 88.2 fL (ref 80.0–100.0)
Platelets: 320 10*3/uL (ref 150–440)
RBC: 3.93 MIL/uL (ref 3.80–5.20)
RDW: 13.7 % (ref 11.5–14.5)
WBC: 13.9 10*3/uL — AB (ref 3.6–11.0)

## 2015-07-13 LAB — SURGICAL PATHOLOGY

## 2015-07-13 MED ORDER — ONDANSETRON HCL 4 MG PO TABS: 4.0000 mg | ORAL_TABLET | Freq: Four times a day (QID) | ORAL | Status: AC | PRN

## 2015-07-13 MED ORDER — IBUPROFEN 600 MG PO TABS: 600.0000 mg | ORAL_TABLET | Freq: Four times a day (QID) | ORAL | Status: AC | PRN

## 2015-07-13 MED ORDER — DOCUSATE SODIUM 100 MG PO CAPS: 200.0000 mg | ORAL_CAPSULE | Freq: Every day | ORAL | Status: AC

## 2015-07-13 MED ORDER — OXYCODONE-ACETAMINOPHEN 5-325 MG PO TABS: 1.0000 | ORAL_TABLET | ORAL | Status: AC | PRN

## 2015-07-13 MED ORDER — DOCUSATE SODIUM 100 MG PO CAPS
200.0000 mg | ORAL_CAPSULE | Freq: Every day | ORAL | Status: DC
  Administered 2015-07-13: 200 mg via ORAL
  Filled 2015-07-13: qty 2

## 2015-07-13 NOTE — Discharge Instructions (Signed)
Signs and Symptoms to Report °Call our office at [phone] if you have any of the following. ° °• Fever over 100.4 degrees or higher °• Severe stomach pain not relieved with pain medications °• Bright red bleeding that’s heavier than a period that does not slow with rest °• To go the bathroom a lot (frequency), you can’t hold your urine (urgency), or it hurts when you empty your bladder (urinate) °• Chest pain °• Shortness of breath °• Pain in the calves of your legs °• Severe nausea and vomiting not relieved with anti-nausea medications °• Signs of infection around your wounds, such as redness, hot to touch, swelling, green/yellow drainage (like pus), bad smelling discharge °• Any concerns ° °What You Can Expect after Surgery °• You may see some pink tinged, bloody fluid and bruising around the wound. This is normal. °• You may have a sore throat because of the tube in your mouth during general anesthesia. This will go away in 2 to 3 days. °• You may have some stomach cramps. °• You may notice spotting on your panties. °• You may have pain around the incision sites. ° ° °Activities after Your Discharge °Follow these guidelines to help speed your recovery at home: °• Do the coughing and deep breathing as you did in the hospital for 2 weeks. Use the small blue breathing device, called the incentive spirometer for 2 weeks. °• Don’t drive if you are in pain or taking narcotic pain medicine. You may drive when you can safely slam on the brakes, turn the wheel forcefully, and rotate your torso comfortably. This is typically 1-2 weeks. Practice in a parking lot or side street prior to attempting to drive regularly.  °• Ask others to help with household chores for 4 weeks. °• Do not lift anything heavier that 10 pounds for 4-6 weeks. This includes pets, children, and groceries. °• Don’t do strenuous activities, exercises, or sports like vacuuming, tennis, squash, etc. until your doctor says it is safe to do so. °---If  you had a hysterectomy (abdominal, laparoscopic, or vaginal) do not have intercourse for 8-10 weeks.  °• Walk as you feel able. Rest often since it may take two or three weeks for your energy level to return to normal.  °• You may climb stairs °• Avoid constipation: °  -Eat fruits, vegetables, and whole grains. Eat small meals as your appetite will take time to return to normal. °  -Drink 6 to 8 glasses of water each day unless your doctor has told you to limit your fluids. °  -Use a laxative or stool softener as needed if constipation becomes a problem. You may take Miralax, metamucil, Citrucil, Colace, Senekot, FiberCon, etc. If this does not relieve the constipation, try two tablespoons of Milk Of Magnesia every 8 hours until your bowels move.  °• You may shower. Gently wash the wounds with a mild soap and water. Pat dry. °• Do not get in a hot tub, swimming pool, etc. until your doctor agrees. °• Do not use lotions, oils, powders on the wounds. °• Do not douche, use tampons, or have sex until your doctor says it is okay. °• Take your pain medicine when you need it. The medicine may not work as well if the pain is bad. ° °Take the medicines you were taking before surgery. Other medications you will need are pain medications (Norco or Percocet) and nausea medications (Zofran).  ° °

## 2015-07-13 NOTE — Progress Notes (Signed)
Pt discharged home.  Discharge instructions, prescriptions and follow up appointment given to and reviewed with pt.  Pt verbalized understanding.  Escorted by auxillary. 

## 2015-07-13 NOTE — Discharge Summary (Signed)
Physician Discharge Summary  Patient ID: Kitti Mcclish MRN: [id number] DOB/AGE: [id number] 52 y.o.  Admit date: 07/12/2015 Discharge date: 07/13/2015  Admission Diagnoses:  Discharge Diagnoses:  Active Problems:   Postoperative state   Discharged Condition: good  Hospital Course: See below  Consults: None  Significant Diagnostic Studies: none   Treatments: surgery: see below  Discharge Exam: Blood pressure 116/60, pulse 63, temperature 98.4 F (36.9 C), temperature source Oral, resp. rate 18, height 5\' 4"  (1.626 m), weight 97.977 kg (216 lb), last menstrual period 07/11/2015, SpO2 99 %. General appearance: alert, cooperative and no distress Resp: clear to auscultation bilaterally Cardio: regular rate and rhythm, S1, S2 normal, no murmur, click, rub or gallop GI: soft, non-tender; bowel sounds normal; no masses,  no organomegaly Extremities: extremities normal, atraumatic, no cyanosis or edema and Homans sign is negative, no sign of DVT Pulses: 2+ and symmetric Skin: Skin color, texture, turgor normal. No rashes or lesions Neurologic: Grossly normal  Disposition: Final discharge disposition not confirmed     Medication List    ASK your doctor about these medications        docusate sodium 250 MG capsule  Commonly known as:  COLACE  Take 250 mg by mouth daily.     lansoprazole 15 MG capsule  Commonly known as:  PREVACID  Take 15 mg by mouth daily.     Magnesium 400 MG Tabs  Take 1 tablet by mouth daily.     ONE-A-DAY WOMENS FORMULA Tabs  Take 1 tablet by mouth daily.         SignedBenjaman Kindler 07/13/2015, 9:05 AM  The patient was admitted on the day of scheduled surgery and proceeded to the operating room as scheduled for TVH, BS without complications, EBL 833 cc.  (For complete operative information, please see operative report).  Postoperatively she was admitted to the floor.  Postoperative labs were stable (Hgb 11.8  and creatinine  -->  0.63).    Foley cath was discontinued on postoperative day number 1 and patient passed a voiding trial without difficulty.   Patient was felt to be stable for discharge on postoperative day number 1 when she was tolerating a regular diet, pain was controlled with po pain medications, and she was ambulating and voiding without difficulty. Vital signs were stable and physical exam remained benign throughout her hospital stay.  She will follow up per below for post-op check and staple removal in 2 weeks.  Rx given for Norco, Zofran, and Colace.    She was given specific instructions and numbers to call in written and verbal format. She verbalized understanding, agrees with the plan of care, and all questions answered to her satisfaction.

## 2015-11-02 ENCOUNTER — Other Ambulatory Visit: Payer: Self-pay | Admitting: Family Medicine

## 2015-11-02 DIAGNOSIS — Z1231 Encounter for screening mammogram for malignant neoplasm of breast: Secondary | ICD-10-CM

## 2015-11-23 ENCOUNTER — Ambulatory Visit
Admission: RE | Admit: 2015-11-23 | Discharge: 2015-11-23 | Disposition: A | Discharge status: Home / Self Care | Payer: Managed Care, Other (non HMO) | Source: Ambulatory Visit | Attending: Family Medicine | Admitting: Family Medicine

## 2015-11-23 DIAGNOSIS — Z1231 Encounter for screening mammogram for malignant neoplasm of breast: Secondary | ICD-10-CM | POA: Diagnosis present

## 2016-11-06 ENCOUNTER — Other Ambulatory Visit: Payer: Self-pay | Admitting: Family Medicine

## 2016-11-06 DIAGNOSIS — Z1239 Encounter for other screening for malignant neoplasm of breast: Secondary | ICD-10-CM

## 2016-12-05 ENCOUNTER — Ambulatory Visit
Admission: RE | Admit: 2016-12-05 | Discharge: 2016-12-05 | Disposition: A | Discharge status: Home / Self Care | Payer: Managed Care, Other (non HMO) | Source: Ambulatory Visit | Attending: Family Medicine | Admitting: Family Medicine

## 2016-12-05 DIAGNOSIS — Z1239 Encounter for other screening for malignant neoplasm of breast: Secondary | ICD-10-CM

## 2016-12-05 DIAGNOSIS — Z1231 Encounter for screening mammogram for malignant neoplasm of breast: Secondary | ICD-10-CM | POA: Diagnosis present

## 2017-02-16 ENCOUNTER — Ambulatory Visit: Payer: Managed Care, Other (non HMO) | Admitting: Physical Therapy

## 2017-02-23 ENCOUNTER — Encounter: Payer: Managed Care, Other (non HMO) | Admitting: Physical Therapy

## 2017-03-09 ENCOUNTER — Encounter: Payer: Managed Care, Other (non HMO) | Admitting: Physical Therapy

## 2017-03-16 ENCOUNTER — Encounter: Payer: Managed Care, Other (non HMO) | Admitting: Physical Therapy

## 2017-03-23 ENCOUNTER — Encounter: Payer: Managed Care, Other (non HMO) | Admitting: Physical Therapy

## 2017-04-06 ENCOUNTER — Encounter: Payer: Managed Care, Other (non HMO) | Admitting: Physical Therapy

## 2017-04-20 ENCOUNTER — Encounter: Payer: Managed Care, Other (non HMO) | Admitting: Physical Therapy

## 2017-11-15 ENCOUNTER — Other Ambulatory Visit: Payer: Self-pay | Admitting: Family Medicine

## 2017-11-15 DIAGNOSIS — Z1239 Encounter for other screening for malignant neoplasm of breast: Secondary | ICD-10-CM

## 2017-12-12 ENCOUNTER — Ambulatory Visit
Admission: RE | Admit: 2017-12-12 | Discharge: 2017-12-12 | Disposition: A | Discharge status: Home / Self Care | Payer: 59 | Source: Ambulatory Visit | Attending: Family Medicine | Admitting: Family Medicine

## 2017-12-12 DIAGNOSIS — Z1231 Encounter for screening mammogram for malignant neoplasm of breast: Secondary | ICD-10-CM | POA: Diagnosis not present

## 2017-12-12 DIAGNOSIS — Z1239 Encounter for other screening for malignant neoplasm of breast: Secondary | ICD-10-CM

## 2019-07-03 ENCOUNTER — Other Ambulatory Visit: Payer: Self-pay | Admitting: Family Medicine

## 2019-07-03 DIAGNOSIS — Z8249 Family history of ischemic heart disease and other diseases of the circulatory system: Secondary | ICD-10-CM

## 2019-07-31 ENCOUNTER — Other Ambulatory Visit: Payer: Self-pay | Admitting: Family Medicine

## 2019-10-08 ENCOUNTER — Other Ambulatory Visit: Payer: Self-pay | Admitting: Family Medicine

## 2019-10-08 DIAGNOSIS — Z8249 Family history of ischemic heart disease and other diseases of the circulatory system: Secondary | ICD-10-CM

## 2019-11-10 ENCOUNTER — Other Ambulatory Visit: Payer: Self-pay

## 2019-11-10 ENCOUNTER — Ambulatory Visit
Admission: RE | Admit: 2019-11-10 | Discharge: 2019-11-10 | Disposition: A | Discharge status: Home / Self Care | Payer: Managed Care, Other (non HMO) | Source: Ambulatory Visit | Attending: Family Medicine | Admitting: Family Medicine

## 2019-11-10 DIAGNOSIS — Z8249 Family history of ischemic heart disease and other diseases of the circulatory system: Secondary | ICD-10-CM | POA: Insufficient documentation

## 2019-11-10 DIAGNOSIS — Z136 Encounter for screening for cardiovascular disorders: Secondary | ICD-10-CM | POA: Insufficient documentation

## 2020-08-04 ENCOUNTER — Other Ambulatory Visit: Payer: Self-pay | Admitting: Family Medicine

## 2020-08-04 DIAGNOSIS — Z1231 Encounter for screening mammogram for malignant neoplasm of breast: Secondary | ICD-10-CM

## 2020-11-23 ENCOUNTER — Other Ambulatory Visit: Payer: Self-pay

## 2020-11-23 ENCOUNTER — Ambulatory Visit
Admission: RE | Admit: 2020-11-23 | Discharge: 2020-11-23 | Disposition: A | Discharge status: Home / Self Care | Payer: Managed Care, Other (non HMO) | Source: Ambulatory Visit | Attending: Family Medicine | Admitting: Family Medicine

## 2020-11-23 DIAGNOSIS — Z1231 Encounter for screening mammogram for malignant neoplasm of breast: Secondary | ICD-10-CM | POA: Diagnosis present

## 2022-08-31 ENCOUNTER — Other Ambulatory Visit: Payer: Self-pay | Admitting: Family Medicine

## 2022-08-31 DIAGNOSIS — Z1231 Encounter for screening mammogram for malignant neoplasm of breast: Secondary | ICD-10-CM

## 2022-09-26 ENCOUNTER — Ambulatory Visit
Admission: RE | Admit: 2022-09-26 | Discharge: 2022-09-26 | Disposition: A | Discharge status: Home / Self Care | Payer: Managed Care, Other (non HMO) | Source: Ambulatory Visit | Attending: Family Medicine | Admitting: Family Medicine

## 2022-09-26 DIAGNOSIS — Z1231 Encounter for screening mammogram for malignant neoplasm of breast: Secondary | ICD-10-CM | POA: Diagnosis not present

## 2022-10-03 ENCOUNTER — Other Ambulatory Visit: Payer: Self-pay | Admitting: Family Medicine

## 2022-10-03 DIAGNOSIS — N6489 Other specified disorders of breast: Secondary | ICD-10-CM

## 2022-10-03 DIAGNOSIS — R928 Other abnormal and inconclusive findings on diagnostic imaging of breast: Secondary | ICD-10-CM

## 2022-10-04 ENCOUNTER — Ambulatory Visit
Admission: RE | Admit: 2022-10-04 | Discharge: 2022-10-04 | Disposition: A | Discharge status: Home / Self Care | Payer: Managed Care, Other (non HMO) | Source: Ambulatory Visit | Attending: Family Medicine | Admitting: Family Medicine

## 2022-10-04 DIAGNOSIS — R928 Other abnormal and inconclusive findings on diagnostic imaging of breast: Secondary | ICD-10-CM

## 2022-10-04 DIAGNOSIS — N6489 Other specified disorders of breast: Secondary | ICD-10-CM | POA: Insufficient documentation

## 2023-01-24 IMAGING — MG MM DIGITAL SCREENING BILAT W/ TOMO AND CAD
8 of 16 series · 8 of 40 positions shown · non-contrast
Comparison: Previous exam(s).

CLINICAL DATA: Screening.

EXAM:
DIGITAL SCREENING BILATERAL MAMMOGRAM WITH TOMOSYNTHESIS AND CAD
TECHNIQUE: Bilateral screening digital craniocaudal and mediolateral oblique
mammograms were obtained. Bilateral screening digital breast
tomosynthesis was performed. The images were evaluated with
computer-aided detection.

[L CC synth-2D]
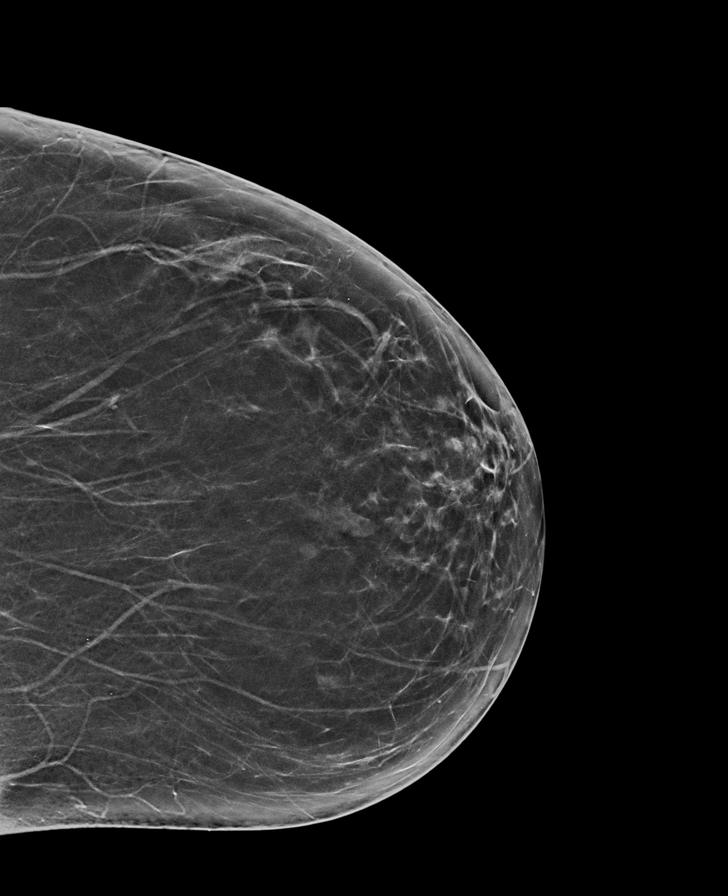

[L MLO synth-2D]
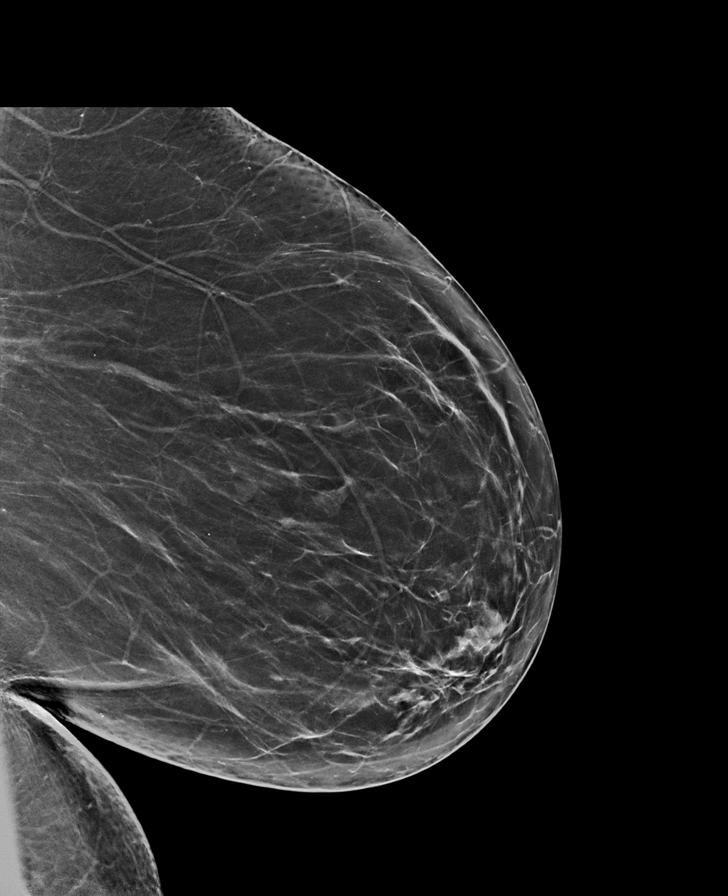

[R MLO synth-2D (1 of 2)]
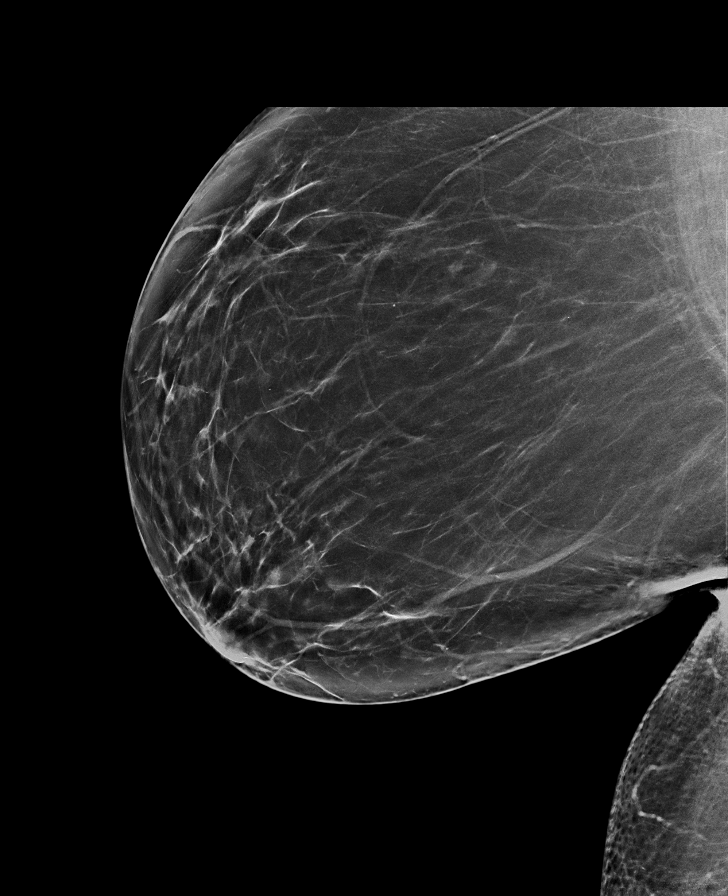

[R CC synth-2D (1 of 3)]
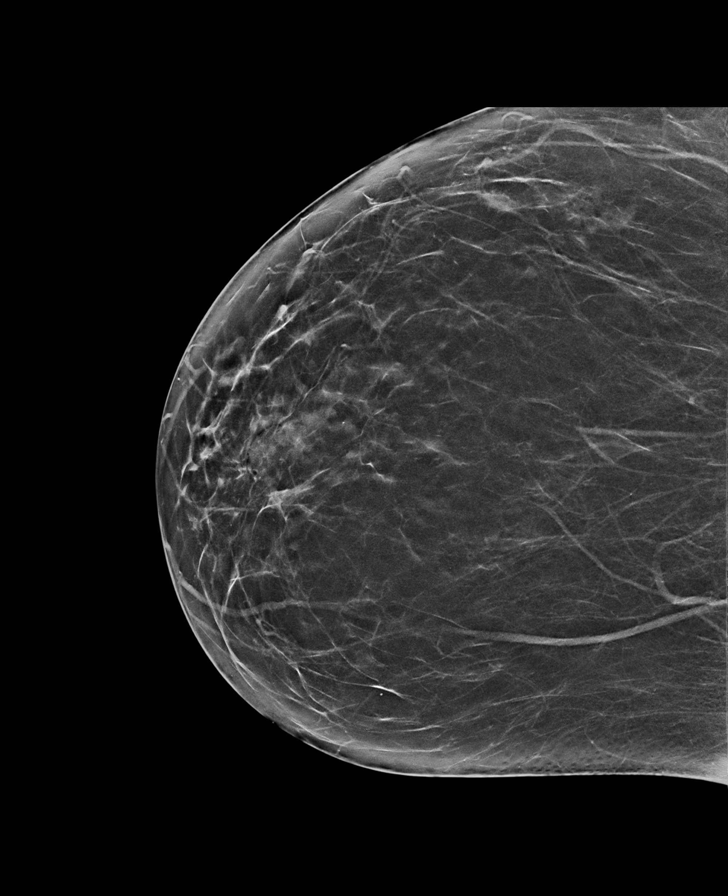

[R CC synth-2D (2 of 3)]
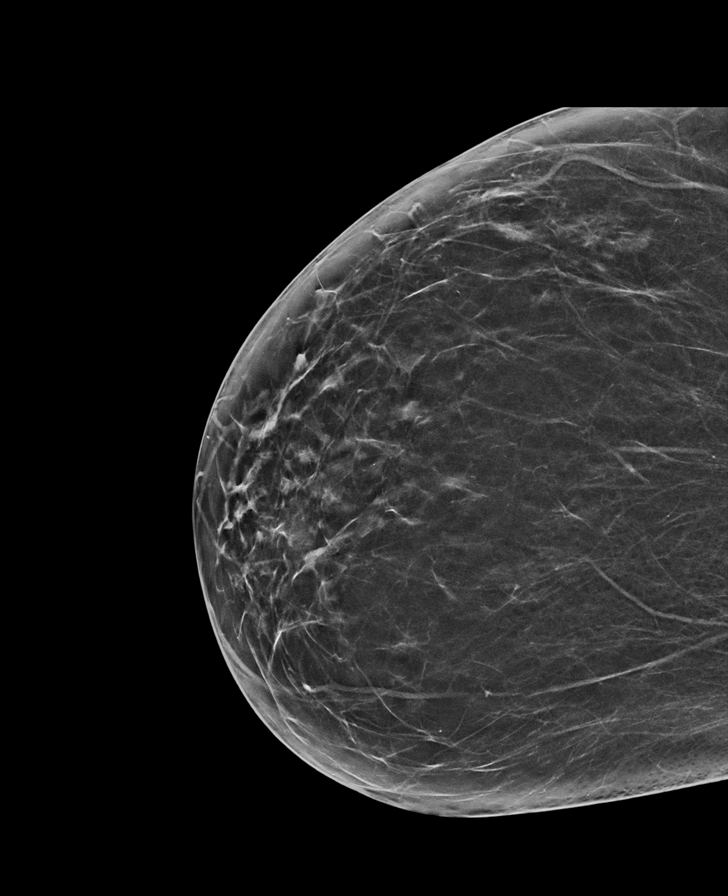

[R CC synth-2D (3 of 3)]
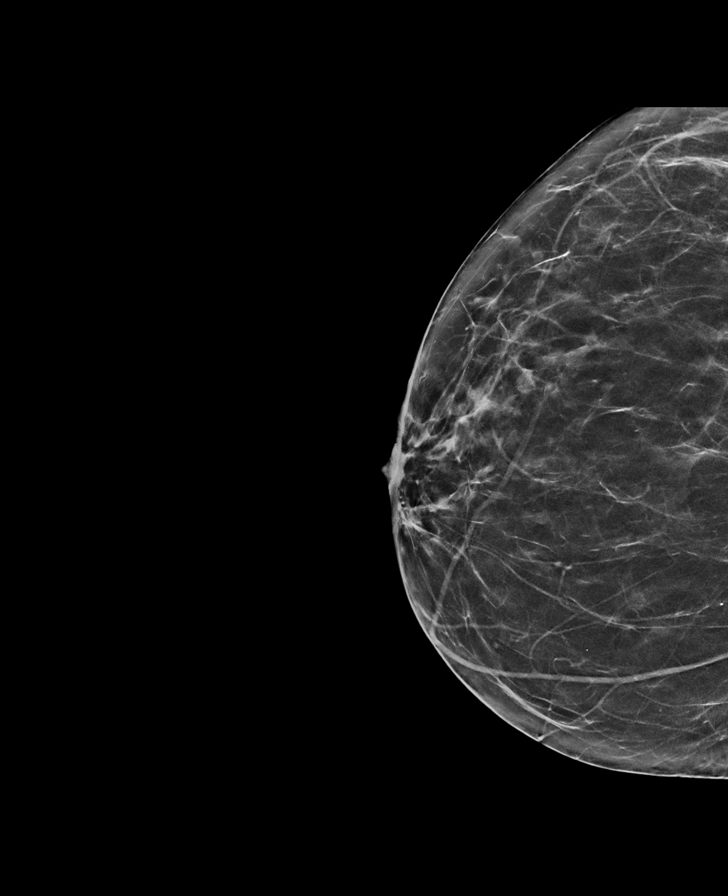

[R MLO synth-2D (2 of 2)]
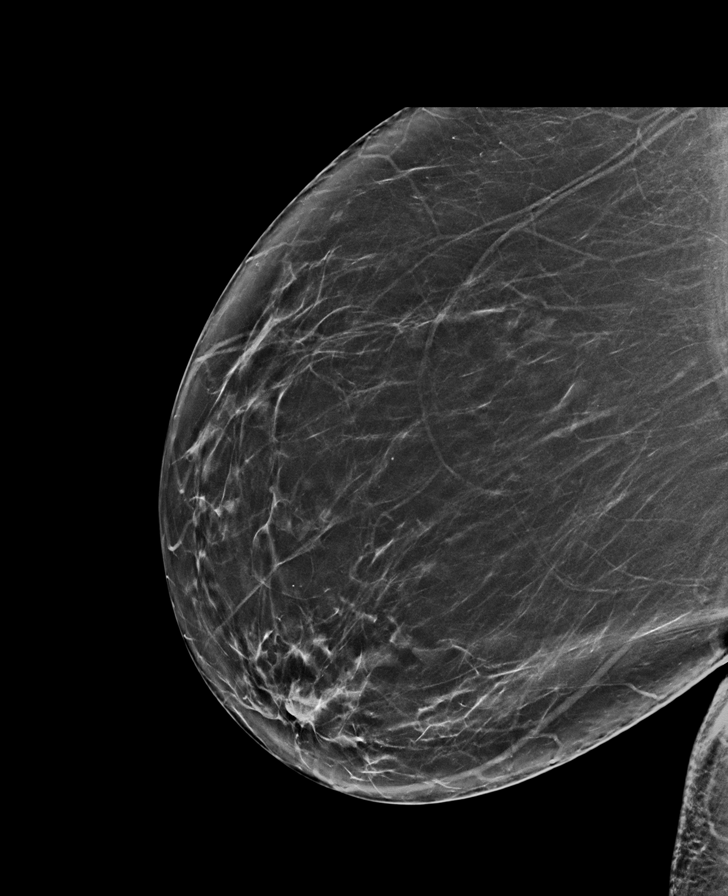

[R MLO tomo · tomo slice 39/78.0]
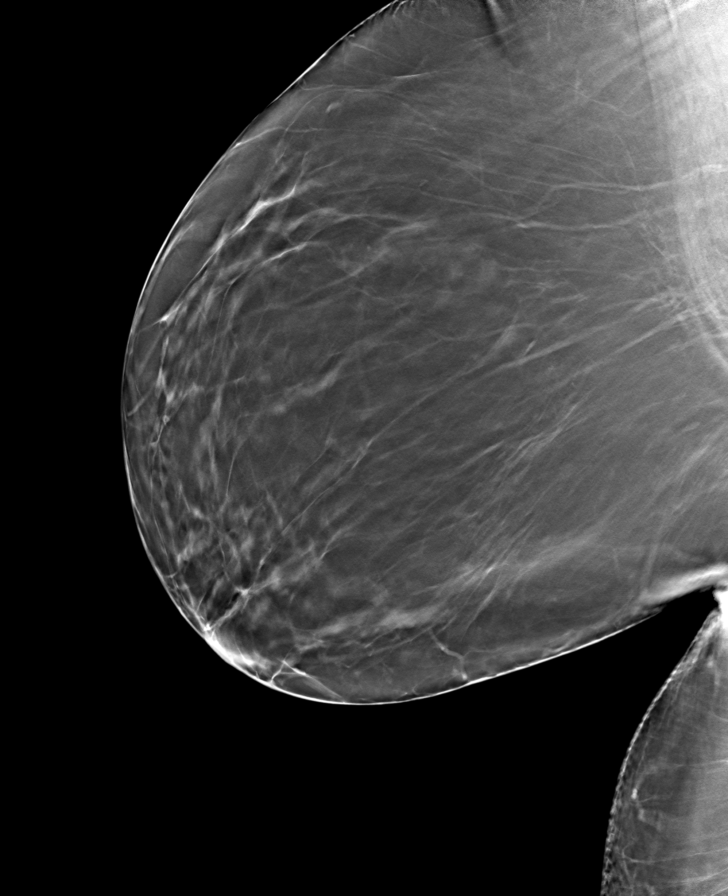

[8 of 40 positions shown; findings below may reference images not displayed]

ACR Breast Density Category b: There are scattered areas of
fibroglandular density.
FINDINGS: There are no findings suspicious for malignancy. The images were
evaluated with computer-aided detection.
IMPRESSION: No mammographic evidence of malignancy. A result letter of this
screening mammogram will be mailed directly to the patient.

RECOMMENDATION:
Screening mammogram in one year. (Code:WJ-I-BG6)

BI-RADS CATEGORY  1: Negative.

## 2023-09-25 ENCOUNTER — Other Ambulatory Visit: Payer: Self-pay | Admitting: Family Medicine

## 2023-09-25 DIAGNOSIS — Z1231 Encounter for screening mammogram for malignant neoplasm of breast: Secondary | ICD-10-CM

## 2023-10-02 ENCOUNTER — Ambulatory Visit
Admission: RE | Admit: 2023-10-02 | Discharge: 2023-10-02 | Disposition: A | Discharge status: Home / Self Care | Payer: Managed Care, Other (non HMO) | Source: Ambulatory Visit | Attending: Family Medicine | Admitting: Family Medicine

## 2023-10-02 DIAGNOSIS — Z1231 Encounter for screening mammogram for malignant neoplasm of breast: Secondary | ICD-10-CM | POA: Diagnosis present

## 2023-12-17 ENCOUNTER — Ambulatory Visit (INDEPENDENT_AMBULATORY_CARE_PROVIDER_SITE_OTHER): Payer: Self-pay

## 2023-12-17 DIAGNOSIS — K640 First degree hemorrhoids: Secondary | ICD-10-CM | POA: Diagnosis not present

## 2023-12-17 DIAGNOSIS — K2960 Other gastritis without bleeding: Secondary | ICD-10-CM | POA: Diagnosis not present

## 2023-12-17 DIAGNOSIS — Z1211 Encounter for screening for malignant neoplasm of colon: Secondary | ICD-10-CM | POA: Diagnosis present

## 2023-12-17 DIAGNOSIS — K449 Diaphragmatic hernia without obstruction or gangrene: Secondary | ICD-10-CM | POA: Diagnosis not present

## 2023-12-17 DIAGNOSIS — D125 Benign neoplasm of sigmoid colon: Secondary | ICD-10-CM | POA: Diagnosis not present

## 2024-09-29 ENCOUNTER — Other Ambulatory Visit: Payer: Self-pay | Admitting: Family Medicine

## 2024-09-29 DIAGNOSIS — Z1231 Encounter for screening mammogram for malignant neoplasm of breast: Secondary | ICD-10-CM

## 2024-11-03 ENCOUNTER — Encounter
